# Patient Record
Sex: Female | Born: 1995 | Race: Black or African American | Hispanic: No | Marital: Single | Smoking: Never smoker
Health system: Southern US, Community
[De-identification: ages and names within clinical notes are randomized; demographics above are authoritative.]

---

## 2019-05-14 ENCOUNTER — Other Ambulatory Visit: Payer: Self-pay

## 2019-05-14 ENCOUNTER — Emergency Department (HOSPITAL_COMMUNITY): Payer: Self-pay

## 2019-05-14 ENCOUNTER — Emergency Department (HOSPITAL_COMMUNITY)
Admission: EM | Admit: 2019-05-14 | Discharge: 2019-05-14 | Disposition: A | Discharge status: Home / Self Care | Payer: Self-pay | Attending: Emergency Medicine | Admitting: Emergency Medicine

## 2019-05-14 ENCOUNTER — Encounter (HOSPITAL_COMMUNITY): Payer: Self-pay | Admitting: Emergency Medicine

## 2019-05-14 DIAGNOSIS — R1011 Right upper quadrant pain: Secondary | ICD-10-CM

## 2019-05-14 DIAGNOSIS — R1010 Upper abdominal pain, unspecified: Secondary | ICD-10-CM

## 2019-05-14 LAB — COMPREHENSIVE METABOLIC PANEL
ALT: 13 U/L (ref 0–44)
AST: 18 U/L (ref 15–41)
Albumin: 3.9 g/dL (ref 3.5–5.0)
Alkaline Phosphatase: 40 U/L (ref 38–126)
Anion gap: 6 (ref 5–15)
BUN: 5 mg/dL — ABNORMAL LOW (ref 6–20)
CO2: 27 mmol/L (ref 22–32)
Calcium: 9.2 mg/dL (ref 8.9–10.3)
Chloride: 106 mmol/L (ref 98–111)
Creatinine, Ser: 0.89 mg/dL (ref 0.44–1.00)
GFR calc Af Amer: >60 mL/min (ref >60)
GFR calc non Af Amer: >60 mL/min (ref >60)
Glucose, Bld: 88 mg/dL (ref 70–99)
Potassium: 4.2 mmol/L (ref 3.5–5.1)
Sodium: 139 mmol/L (ref 135–145)
Total Bilirubin: 0.4 mg/dL (ref 0.3–1.2)
Total Protein: 6.6 g/dL (ref 6.5–8.1)

## 2019-05-14 LAB — CBC
HCT: 37.3 % (ref 36.0–46.0)
Hemoglobin: 12.3 g/dL (ref 12.0–15.0)
MCH: 30.9 pg (ref 26.0–34.0)
MCHC: 33 g/dL (ref 30.0–36.0)
MCV: 93.7 fL (ref 80.0–100.0)
Platelets: 267 10*3/uL (ref 150–400)
RBC: 3.98 MIL/uL (ref 3.87–5.11)
RDW: 12.1 % (ref 11.5–15.5)
WBC: 4.6 10*3/uL (ref 4.0–10.5)
nRBC: 0 % (ref 0.0–0.2)

## 2019-05-14 LAB — URINALYSIS, ROUTINE W REFLEX MICROSCOPIC
Bilirubin Urine: NEGATIVE
Glucose, UA: NEGATIVE mg/dL
Hgb urine dipstick: NEGATIVE
Ketones, ur: NEGATIVE mg/dL
Nitrite: NEGATIVE
Protein, ur: NEGATIVE mg/dL
Specific Gravity, Urine: 1.038 — ABNORMAL HIGH (ref 1.005–1.030)
pH: 7 (ref 5.0–8.0)

## 2019-05-14 LAB — LIPASE, BLOOD: Lipase: 21 U/L (ref 11–51)

## 2019-05-14 LAB — I-STAT BETA HCG BLOOD, ED (MC, WL, AP ONLY): I-stat hCG, quantitative: <5 m[IU]/mL (ref <5)

## 2019-05-14 MED ORDER — FAMOTIDINE 20 MG PO TABS: 20.0000 mg | ORAL_TABLET | Freq: Two times a day (BID) | ORAL | 0 refills | Status: AC

## 2019-05-14 MED ORDER — HYDROMORPHONE HCL 1 MG/ML IJ SOLN
0.5000 mg | Freq: Once | INTRAMUSCULAR | Status: AC
  Administered 2019-05-14: 0.5 mg via INTRAVENOUS
  Filled 2019-05-14: qty 1

## 2019-05-14 MED ORDER — FAMOTIDINE 20 MG PO TABS
20.0000 mg | ORAL_TABLET | Freq: Once | ORAL | Status: AC
  Administered 2019-05-14: 20 mg via ORAL
  Filled 2019-05-14: qty 1

## 2019-05-14 MED ORDER — IOHEXOL 300 MG/ML  SOLN
100.0000 mL | Freq: Once | INTRAMUSCULAR | Status: AC | PRN
  Administered 2019-05-14: 100 mL via INTRAVENOUS

## 2019-05-14 MED ORDER — SUCRALFATE 1 G PO TABS: 1.0000 g | ORAL_TABLET | Freq: Three times a day (TID) | ORAL | 0 refills | Status: AC

## 2019-05-14 MED ORDER — SODIUM CHLORIDE 0.9% FLUSH: 3.0000 mL | Freq: Once | INTRAVENOUS | Status: DC

## 2019-05-14 MED ORDER — ALUM & MAG HYDROXIDE-SIMETH 200-200-20 MG/5ML PO SUSP
30.0000 mL | Freq: Once | ORAL | Status: AC
  Administered 2019-05-14: 30 mL via ORAL
  Filled 2019-05-14: qty 30

## 2019-05-14 MED ORDER — LIDOCAINE VISCOUS HCL 2 % MT SOLN
15.0000 mL | Freq: Once | OROMUCOSAL | Status: AC
  Administered 2019-05-14: 15 mL via ORAL
  Filled 2019-05-14: qty 15

## 2019-05-14 NOTE — ED Notes (Signed)
The pt reports that she just returned from ultra sound   She still has some abd pain but it is not as bad

## 2019-05-14 NOTE — Discharge Instructions (Signed)
1. Medications: Start taking Pepcid twice daily with meals for the next 2 weeks.  Take Carafate 3 times daily with meals and once at night for the next week. 2. Treatment: rest, drink plenty of fluids, advance diet slowly.  Avoid spicy foods, fried foods, fatty foods, alcohol, and acidic foods.  I have attached information for food choices you can make that can be more helpful.  Also try not to eat too close to bedtime; I usually recommend 2 hours between your last meal and bedtime. 3. Follow Up: Please followup with your primary doctor or gastroenterologist in 3 days for discussion of your diagnoses and further evaluation after today's visit; if you do not have a primary care doctor use the resource guide provided to find one; Please return to the ER for persistent vomiting, high fevers or worsening symptoms

## 2019-05-14 NOTE — ED Notes (Signed)
Patient transported to Ultrasound 

## 2019-05-14 NOTE — ED Triage Notes (Signed)
C/o intermittent sharp upper abd pain x 2 days.  Thought it was gas or constipation but has had normal BM.  Denies nausea, vomiting.  Mild pain with urination.

## 2019-05-14 NOTE — ED Provider Notes (Signed)
[facility] EMERGENCY DEPARTMENT Provider Note   CSN: [id number] Arrival date & time: 05/14/19  1150     History   Chief Complaint Chief Complaint  Patient presents with   Abdominal Pain    HPI Margaret Blanchard is a 23 y.o. female presents for evaluation of acute onset, progressively worsening upper abdominal pain for 3 days.  Reports constant sharp stabbing pain which radiates to the bilateral flanks, worsens after certain meals.  Initially the patient thought that she had gas or was constipated but has been having normal bowel movements.  Denies nausea, vomiting, chest pain, shortness of breath, or fevers.  She has noted that the pain in the upper abdomen worsens with urination but denies dysuria, hematuria, urgency, frequency, and vaginal itching bleeding or discharge.  She has not tried anything for her symptoms.     The history is provided by the patient.    History reviewed. No pertinent past medical history.  There are no active problems to display for this patient.   History reviewed. No pertinent surgical history.   OB History   No obstetric history on file.      Home Medications    Prior to Admission medications   Medication Sig Start Date End Date Taking? Authorizing Provider  famotidine (PEPCID) 20 MG tablet Take 1 tablet (20 mg total) by mouth 2 (two) times daily. 05/14/19   Karell Tukes A, PA-C  sucralfate (CARAFATE) 1 g tablet Take 1 tablet (1 g total) by mouth 4 (four) times daily -  with meals and at bedtime. 05/14/19   Jeanie Sewer, PA-C    Family History No family history on file.  Social History Social History   Tobacco Use   Smoking status: Never Smoker   Smokeless tobacco: Never Used  Substance Use Topics   Alcohol use: Not Currently   Drug use: Never     Allergies   Patient has no allergy information on record.   Review of Systems Review of Systems  Constitutional: Negative for chills and fever.    Respiratory: Negative for chest tightness and shortness of breath.   Cardiovascular: Negative for chest pain.  Gastrointestinal: Positive for abdominal pain. Negative for constipation, nausea and vomiting.  All other systems reviewed and are negative.    Physical Exam Updated Vital Signs BP 128/87 (BP Location: Right Arm)    Pulse (!) 57    Temp 98.5 F (36.9 C) (Oral)    Resp 14    LMP 05/03/2019    SpO2 100%   Physical Exam Vitals signs and nursing note reviewed.  Constitutional:      General: She is not in acute distress.    Appearance: She is well-developed.     Comments: Appears uncomfortable, resting in bed  HENT:     Head: Normocephalic and atraumatic.  Eyes:     General:        Right eye: No discharge.        Left eye: No discharge.     Conjunctiva/sclera: Conjunctivae normal.  Neck:     Vascular: No JVD.     Trachea: No tracheal deviation.  Cardiovascular:     Rate and Rhythm: Normal rate and regular rhythm.     Heart sounds: Normal heart sounds.  Pulmonary:     Effort: Pulmonary effort is normal.     Breath sounds: Normal breath sounds.  Abdominal:     General: Abdomen is flat. Bowel sounds are increased. There is no distension.  Palpations: Abdomen is soft.     Tenderness: There is abdominal tenderness in the right upper quadrant, epigastric area and left upper quadrant. There is right CVA tenderness. There is no left CVA tenderness, guarding or rebound. Negative signs include Murphy's sign.  Skin:    General: Skin is warm and dry.     Findings: No erythema.  Neurological:     Mental Status: She is alert.  Psychiatric:        Behavior: Behavior normal.      ED Treatments / Results  Labs (all labs ordered are listed, but only abnormal results are displayed) Labs Reviewed  COMPREHENSIVE METABOLIC PANEL - Abnormal; Notable for the following components:      Result Value   BUN 5 (*)    All other components within normal limits  URINALYSIS, ROUTINE  W REFLEX MICROSCOPIC - Abnormal; Notable for the following components:   APPearance HAZY (*)    Specific Gravity, Urine 1.038 (*)    Leukocytes,Ua TRACE (*)    Bacteria, UA RARE (*)    All other components within normal limits  LIPASE, BLOOD  CBC  I-STAT BETA HCG BLOOD, ED ([facility], [facility], [facility] ONLY)    EKG None  Radiology Ct Abdomen Pelvis W Contrast  Result Date: 05/14/2019 CLINICAL DATA:  Right upper quadrant abdominal pain for 2 days EXAM: CT ABDOMEN AND PELVIS WITH CONTRAST TECHNIQUE: Multidetector CT imaging of the abdomen and pelvis was performed using the standard protocol following bolus administration of intravenous contrast. CONTRAST:  126mL OMNIPAQUE IOHEXOL 300 MG/ML  SOLN COMPARISON:  Same-day right upper quadrant ultrasound FINDINGS: Lower chest: No acute abnormality. Hepatobiliary: No focal liver abnormality is seen. No gallstones, gallbladder wall thickening, or biliary dilatation. Pancreas: Unremarkable. No pancreatic ductal dilatation or surrounding inflammatory changes. Spleen: Normal in size without focal abnormality. Adrenals/Urinary Tract: Adrenal glands are unremarkable. Kidneys are normal, without renal calculi, focal lesion, or hydronephrosis. Bladder is unremarkable. Stomach/Bowel: Stomach is within normal limits. Appendix is not definitively identified given the lack of intra-abdominal fat. No pericecal inflammatory changes are evident to suggest acute appendicitis. No evidence of bowel wall thickening, distention, or inflammatory changes. Vascular/Lymphatic: No significant vascular findings are present. No enlarged abdominal or pelvic lymph nodes. Reproductive: Unremarkable uterus. Small bilateral ovarian cysts, which do not meet size criteria for dedicated or follow-up imaging. Other: Trace free fluid within the posterior cul-de-sac, which may be physiologic. No abdominal wall hernia. Musculoskeletal: No acute or significant osseous findings. IMPRESSION: 1. No CT findings to  explain the patient's right upper quadrant pain. 2. Trace free fluid in the posterior cul-de-sac, which may be physiologic. Electronically Signed   By: Davina Poke M.D.   On: 05/14/2019 13:31   US Abdomen Limited Ruq  Result Date: 05/14/2019 CLINICAL DATA:  3 day history of worsening abdominal pain. EXAM: ULTRASOUND ABDOMEN LIMITED RIGHT UPPER QUADRANT COMPARISON:  None. FINDINGS: Gallbladder: No gallstones or gallbladder wall thickening. No pericholecystic fluid. The sonographer reports no sonographic Murphy's sign. Common bile duct: Diameter: 2 mm Liver: No focal lesion identified. Within normal limits in parenchymal echogenicity. Portal vein is patent on color Doppler imaging with normal direction of blood flow towards the liver. Other: None. IMPRESSION: Unremarkable study. Electronically Signed   By: Misty Stanley M.D.   On: 05/14/2019 13:15    Procedures Procedures (including critical care time)  Medications Ordered in ED Medications  HYDROmorphone (DILAUDID) injection 0.5 mg (0.5 mg Intravenous Given 05/14/19 1252)  iohexol (OMNIPAQUE) 300 MG/ML solution 100 mL (  100 mLs Intravenous Contrast Given 05/14/19 1311)  famotidine (PEPCID) tablet 20 mg (20 mg Oral Given 05/14/19 1352)  alum & mag hydroxide-simeth (MAALOX/MYLANTA) 200-200-20 MG/5ML suspension 30 mL (30 mLs Oral Given 05/14/19 1352)    And  lidocaine (XYLOCAINE) 2 % viscous mouth solution 15 mL (15 mLs Oral Given 05/14/19 1352)     Initial Impression / Assessment and Plan / ED Course  I have reviewed the triage vital signs and the nursing notes.  Pertinent labs & imaging results that were available during my care of the patient were reviewed by me and considered in my medical decision making (see chart for details).        Patient presenting for evaluation of upper abdominal pain for 3 days.  She is afebrile, vital signs are stable.  She is nontoxic in appearance.  On examination has upper abdominal pain, maximally  tender in the right upper quadrant but Murphy sign absent.  No peritoneal signs. No lower abdominal pain on examination. Labwork reviewed by me shows no leukocytosis, no anemia, no metabolic derangements, no renal insufficiency.  UA does not suggest UTI or nephrolithiasis.  Given she is maximally tender to palpation in the right upper quadrant or right upper quadrant ultrasound was obtained which shows no evidence of acute hepatobiliary pathology.  CT shows no evidence of acute surgical abdominal pathology including obstruction, perforation, appendicitis, cholecystitis, or perfed diverticulum.  Creatinine, lipase, LFTs are within normal limits.  Doubt PID, TOA, ovarian torsion, or ectopic pregnancy in the absence of GU complaints or lower abdominal pain on examination.  On reevaluation patient is resting comfortably no apparent distress.  Serial abdominal examinations are benign and she is tolerating p.o. food and fluids without difficulty.  Reports improvement after Pepcid and GI cocktail.  Suspect possible GERD/PUD.  Will discharge with course of Pepcid and Carafate, discussed dietary modifications.  Recommend follow with PCP or gastroenterology for reevaluation of symptoms.  Discussed strict ED return precautions. Pt verbalized understanding of and agreement with plan and is safe for discharge home at this time.   Final Clinical Impressions(s) / ED Diagnoses   Final diagnoses:  RUQ pain  Pain of upper abdomen    ED Discharge Orders         Ordered    famotidine (PEPCID) 20 MG tablet  2 times daily     05/14/19 1548    sucralfate (CARAFATE) 1 g tablet  3 times daily with meals & bedtime     05/14/19 1548           Jeanie SewerFawze, Uchenna Seufert A, PA-C 05/15/19 [id number]    Alvira MondaySchlossman, Erin, MD 05/17/19 1304

## 2021-07-02 IMAGING — CT CT ABD-PELV W/ CM
2 of 4 series · 16 of 46 positions shown, 18 images · IV contrast (Omni 300)
Comparison: Same-day right upper quadrant ultrasound

CLINICAL DATA: Right upper quadrant abdominal pain for 2 days

EXAM:
CT ABDOMEN AND PELVIS WITH CONTRAST
TECHNIQUE: Multidetector CT imaging of the abdomen and pelvis was performed
using the standard protocol following bolus administration of
intravenous contrast.
CONTRAST:  100mL OMNIPAQUE IOHEXOL 300 MG/ML  SOLN

[Series 3: a/p w/ 5mm · axial · 0.74mm/px · z∈[+782,+1177]mm · 13 of 87 slices shown, 15 images]
[im 4/87  soft-tissue]
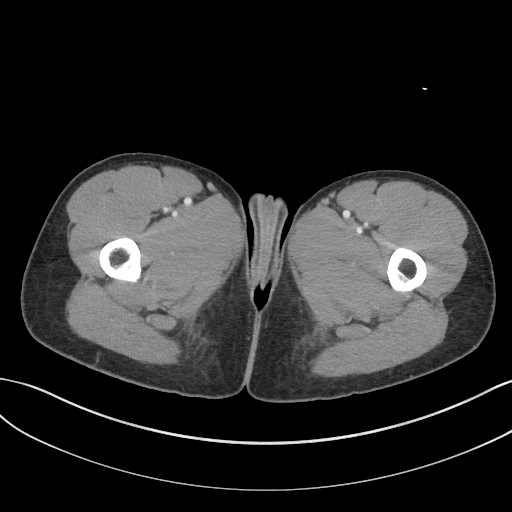
[im 4/87  bone]
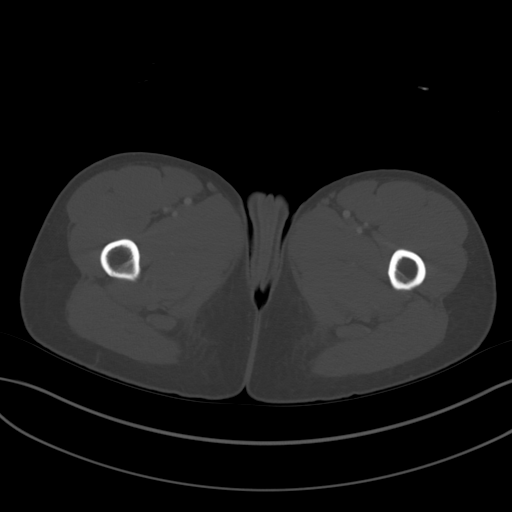
[im 10/87  soft-tissue]
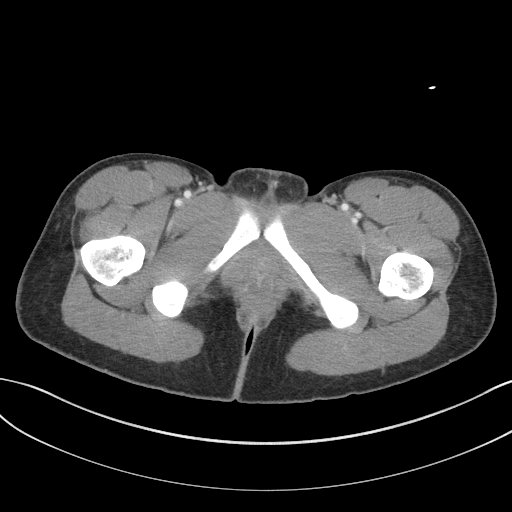
[im 17/87  soft-tissue]
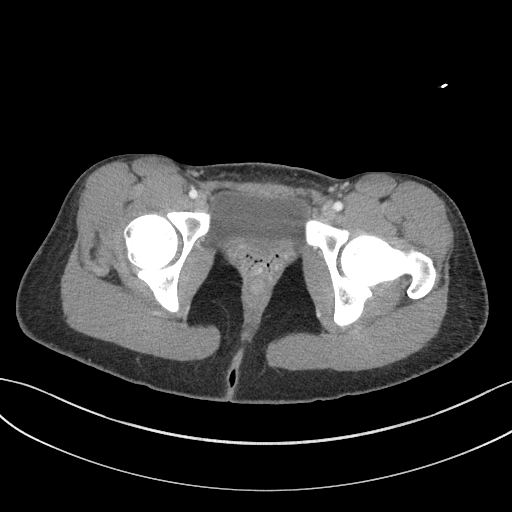
[im 24/87  soft-tissue]
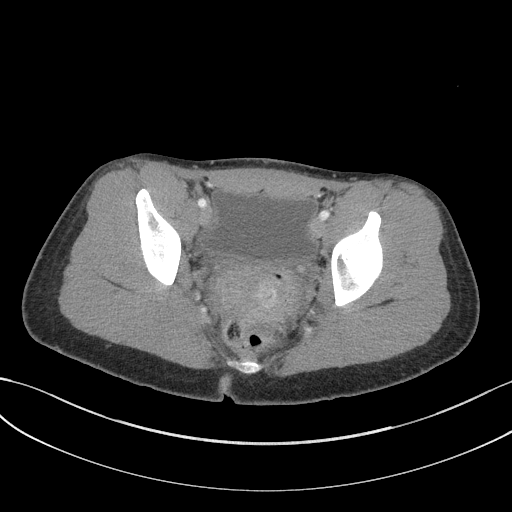
[im 30/87  soft-tissue]
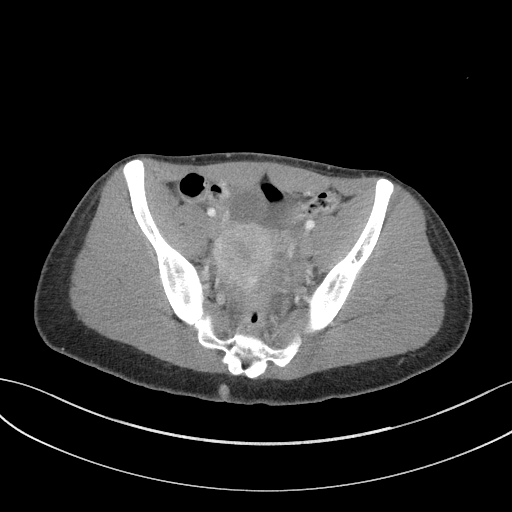
[im 37/87  soft-tissue]
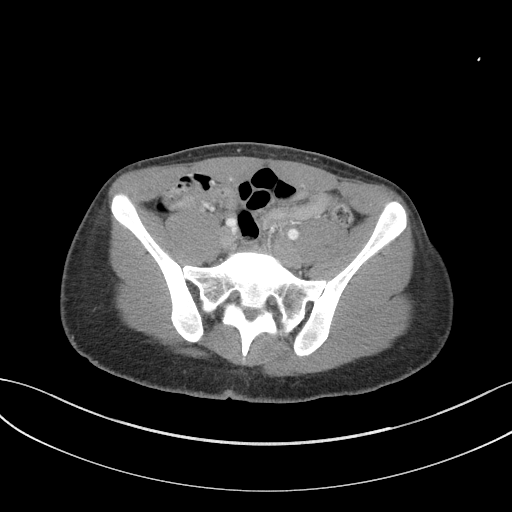
[im 44/87  soft-tissue]
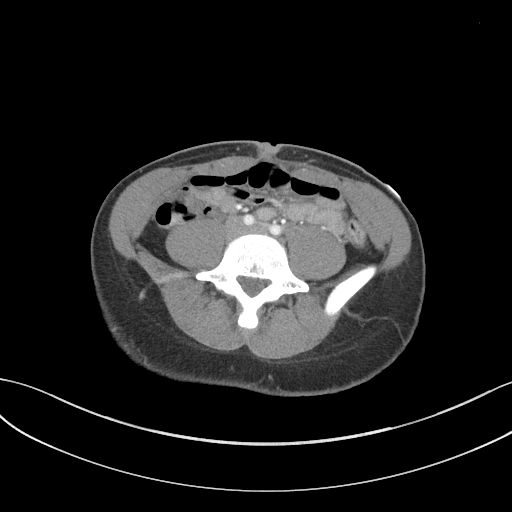
[im 50/87  soft-tissue]
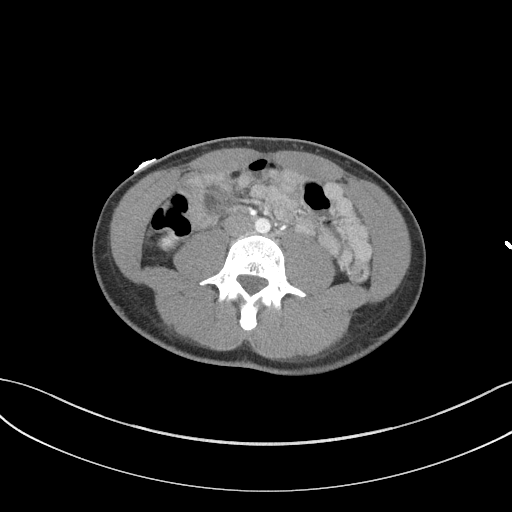
[im 57/87  soft-tissue]
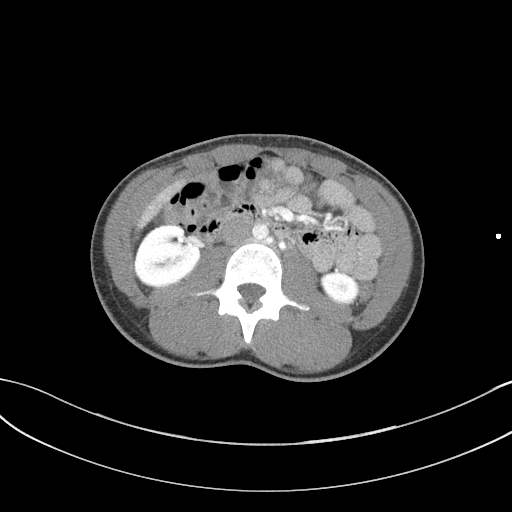
[im 57/87  bone]
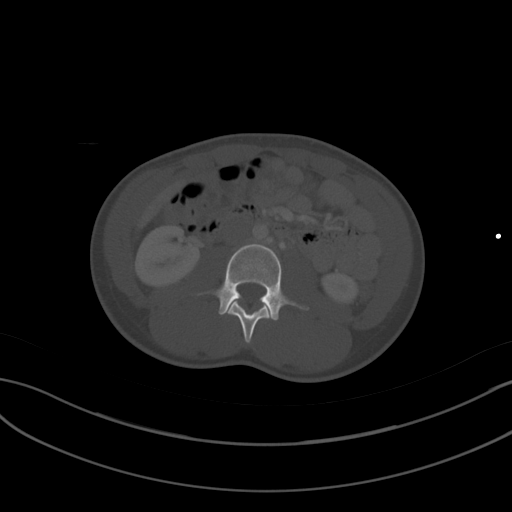
[im 63/87  soft-tissue]
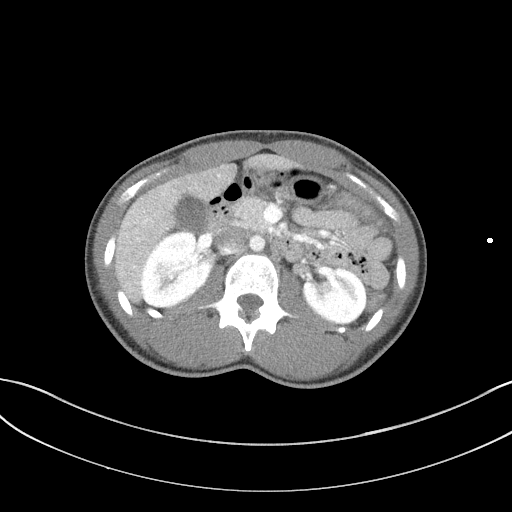
[im 70/87  soft-tissue]
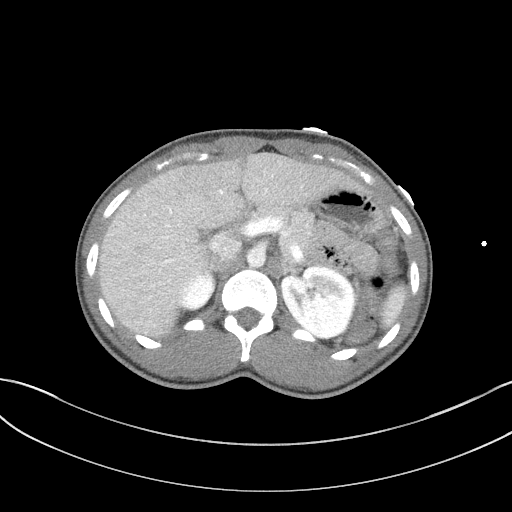
[im 77/87  soft-tissue]
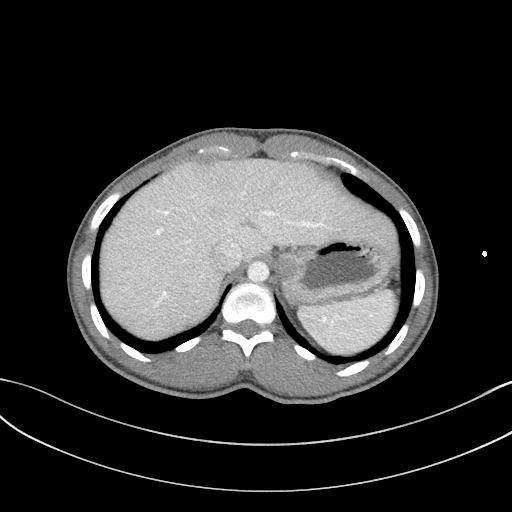
[im 83/87  soft-tissue]
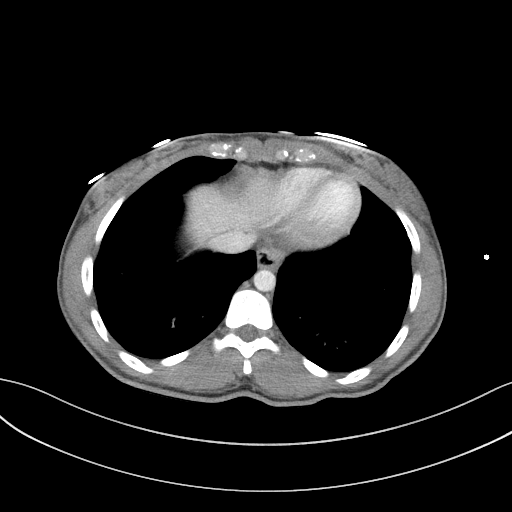

[Series 6: a/p w/ cor · coronal · 0.77mm/px · 3 of 127 slices shown]
[im 43/127  soft-tissue]
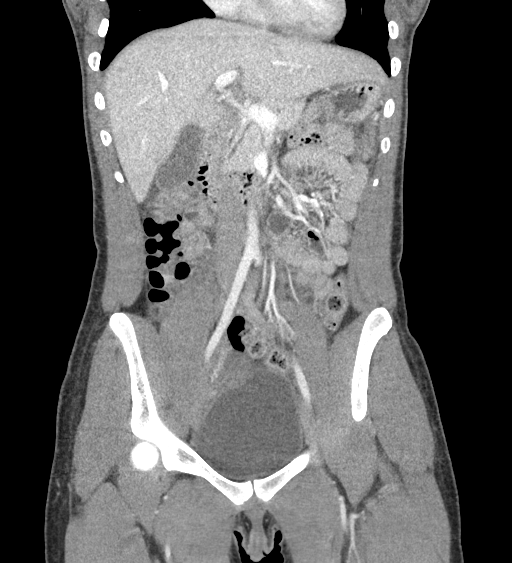
[im 57/127  soft-tissue]
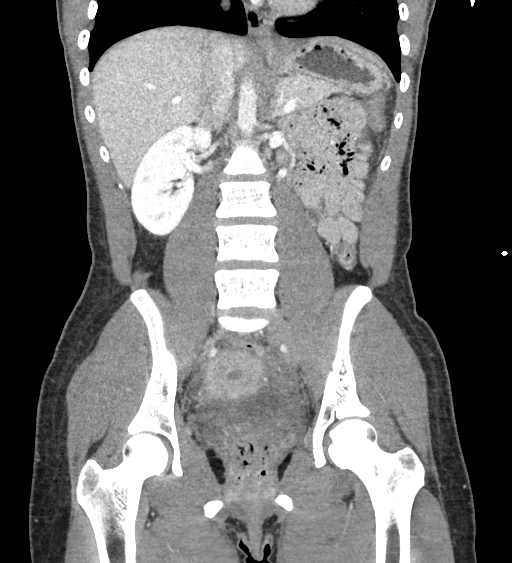
[im 71/127  soft-tissue]
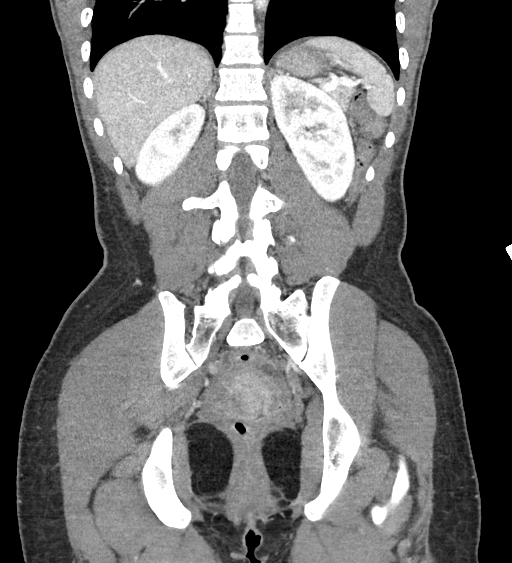

[16 of 46 positions shown; findings below may reference images not displayed]

FINDINGS: Lower chest: No acute abnormality.

Hepatobiliary: No focal liver abnormality is seen. No gallstones,
gallbladder wall thickening, or biliary dilatation.

Pancreas: Unremarkable. No pancreatic ductal dilatation or
surrounding inflammatory changes.

Spleen: Normal in size without focal abnormality.

Adrenals/Urinary Tract: Adrenal glands are unremarkable. Kidneys are
normal, without renal calculi, focal lesion, or hydronephrosis.
Bladder is unremarkable.

Stomach/Bowel: Stomach is within normal limits. Appendix is not
definitively identified given the lack of intra-abdominal fat. No
pericecal inflammatory changes are evident to suggest acute
appendicitis. No evidence of bowel wall thickening, distention, or
inflammatory changes.

Vascular/Lymphatic: No significant vascular findings are present. No
enlarged abdominal or pelvic lymph nodes.

Reproductive: Unremarkable uterus. Small bilateral ovarian cysts,
which do not meet size criteria for dedicated or follow-up imaging.

Other: Trace free fluid within the posterior cul-de-sac, which may
be physiologic. No abdominal wall hernia.

Musculoskeletal: No acute or significant osseous findings.
IMPRESSION: 1. No CT findings to explain the patient's right upper quadrant
pain.
2. Trace free fluid in the posterior cul-de-sac, which may be
physiologic.

## 2021-07-02 IMAGING — US US ABDOMEN LIMITED
1 series · 14 of 25 positions shown · non-contrast
Comparison: None.

CLINICAL DATA: 3 day history of worsening abdominal pain.

EXAM:
ULTRASOUND ABDOMEN LIMITED RIGHT UPPER QUADRANT

[Series 1: us abdomen limited · 14 of 52 slices shown]
[im 1/52]
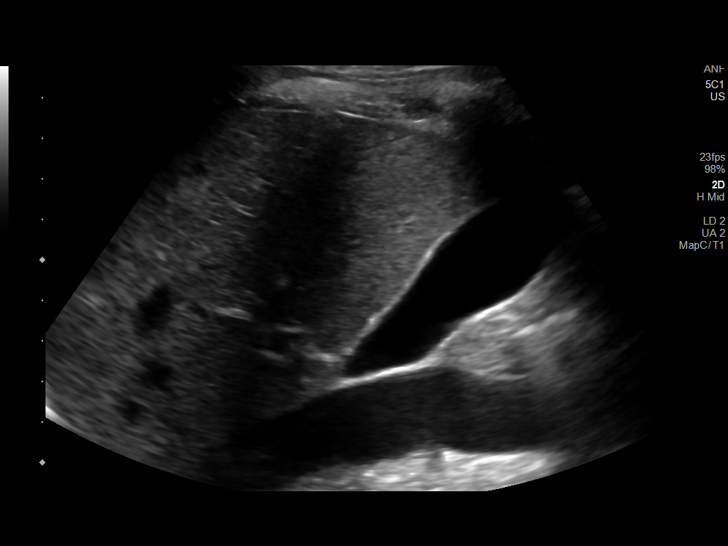
[im 5/52]
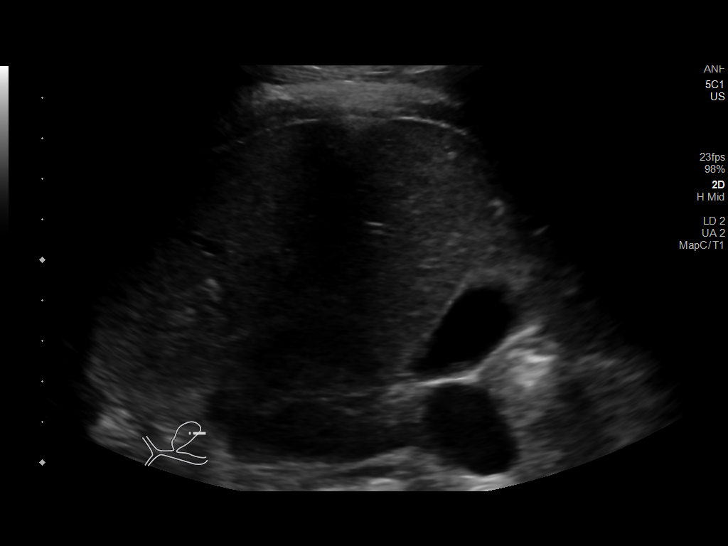
[im 9/52]
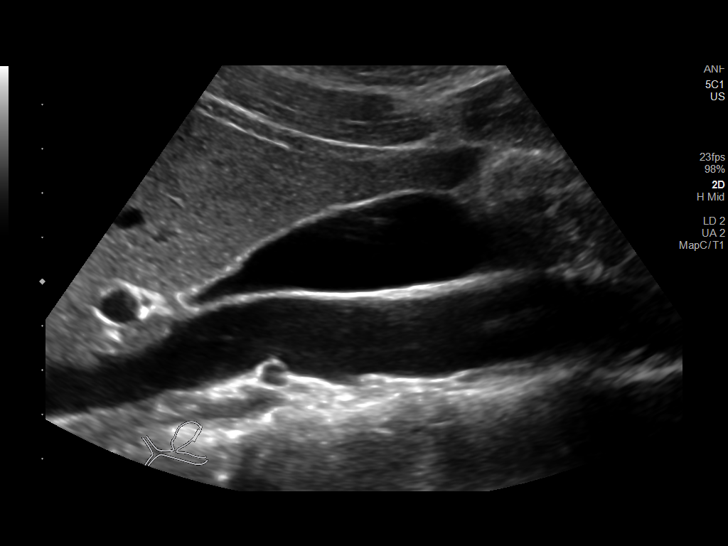
[im 13/52]
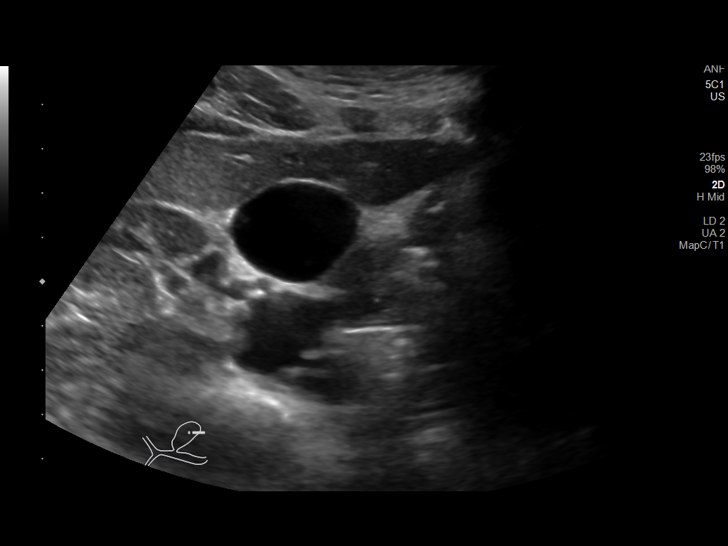
[im 18/52]
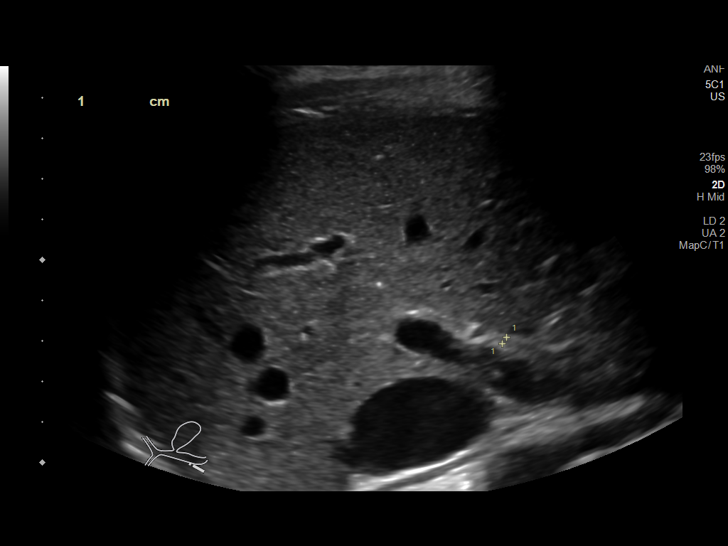
[im 20/52]
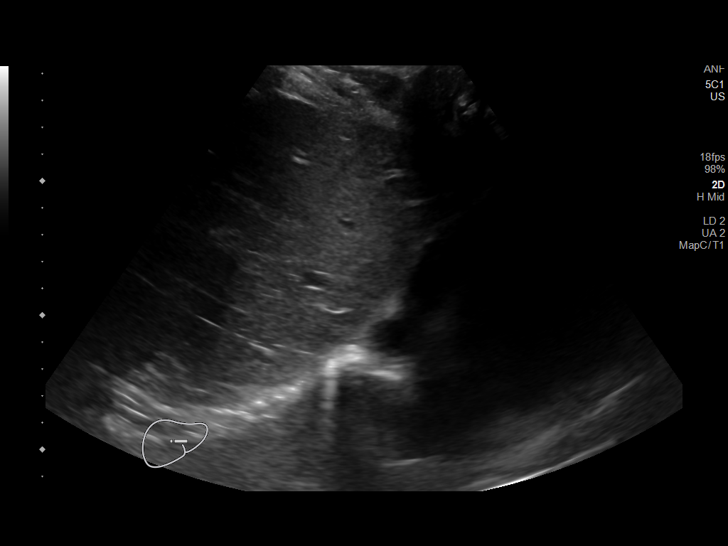
[im 24/52]
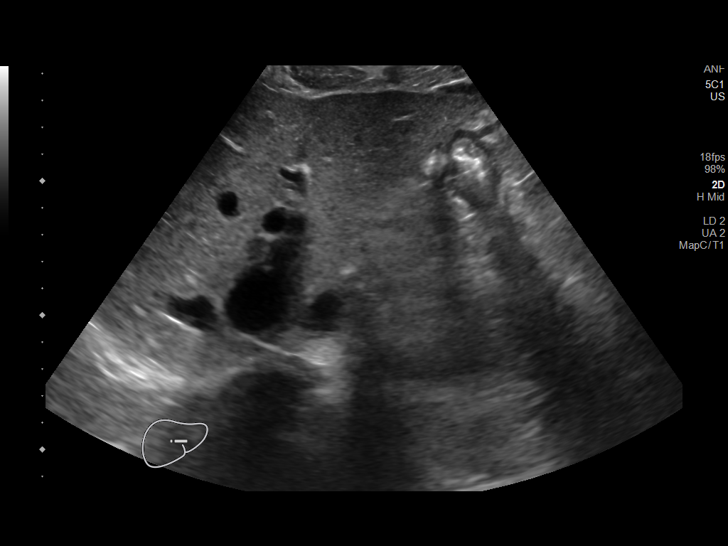
[im 28/52]
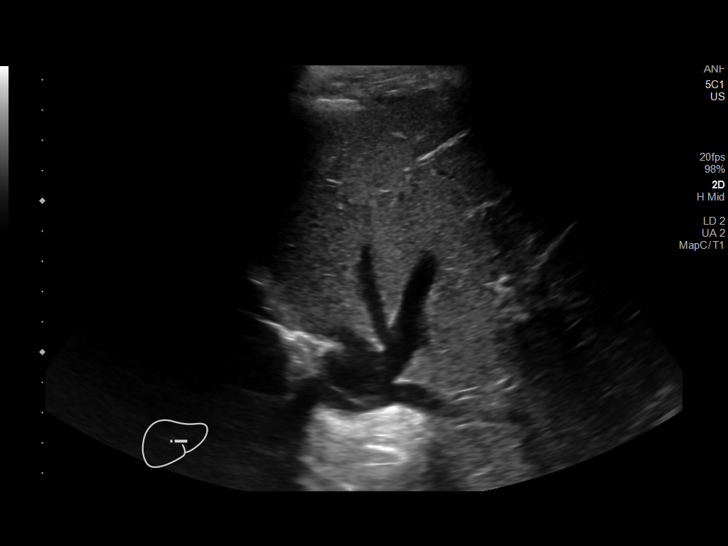
[im 32/52]
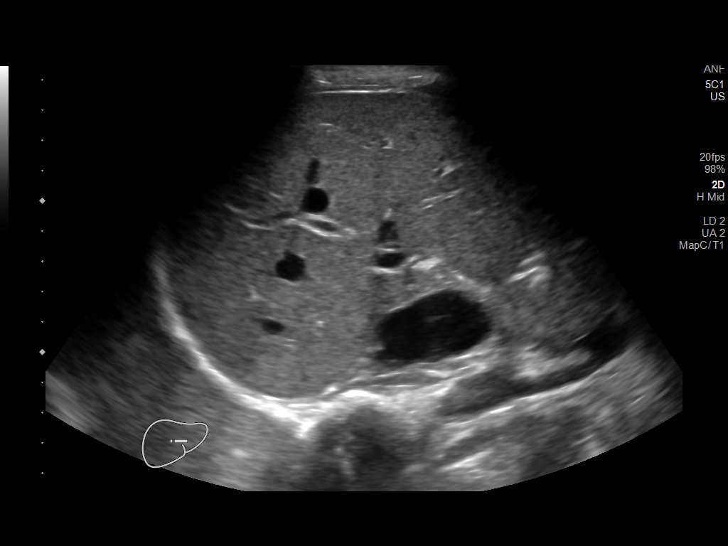
[im 35/52]
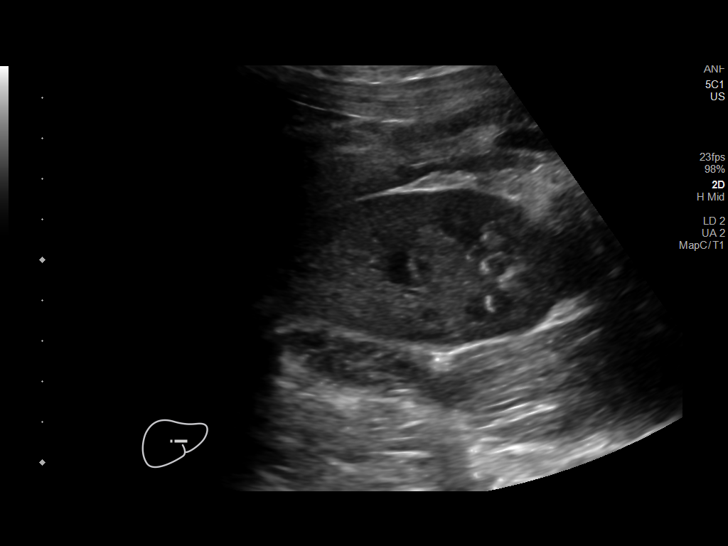
[im 39/52]
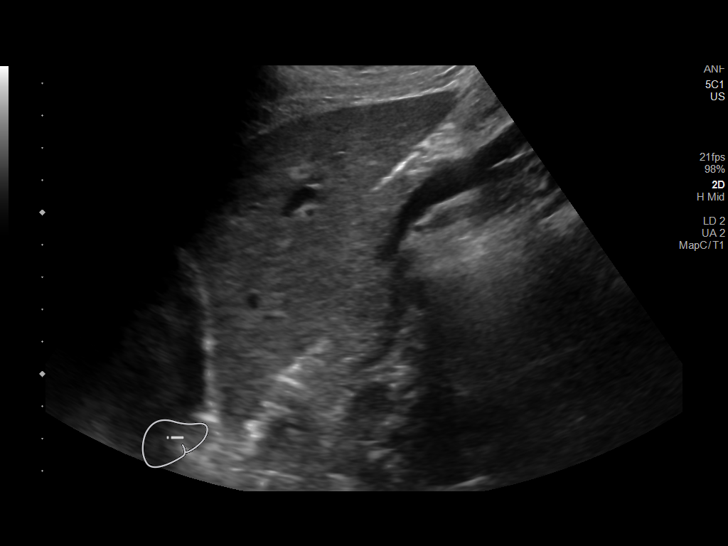
[im 43/52]
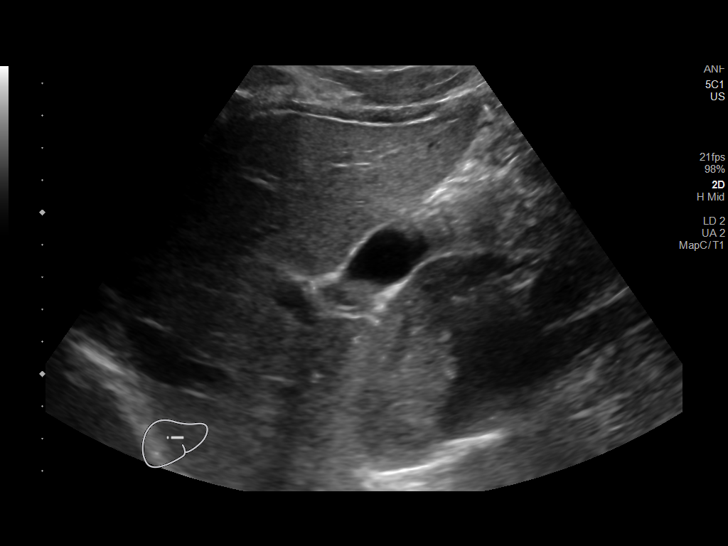
[im 47/52]
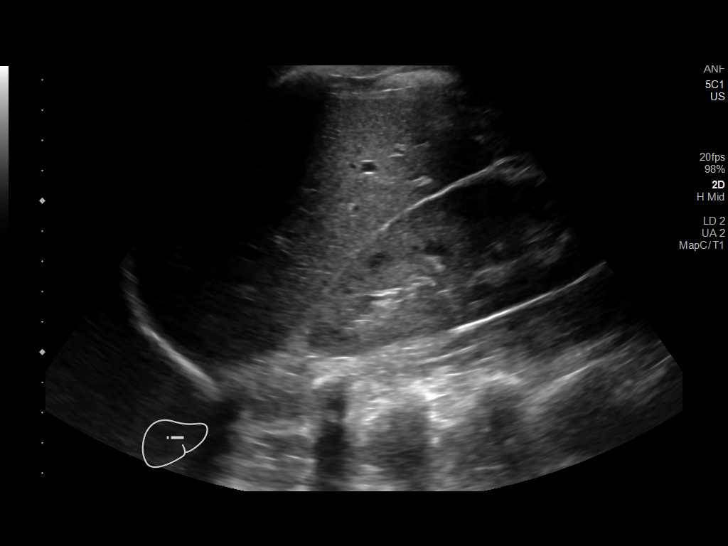
[im 52/52]
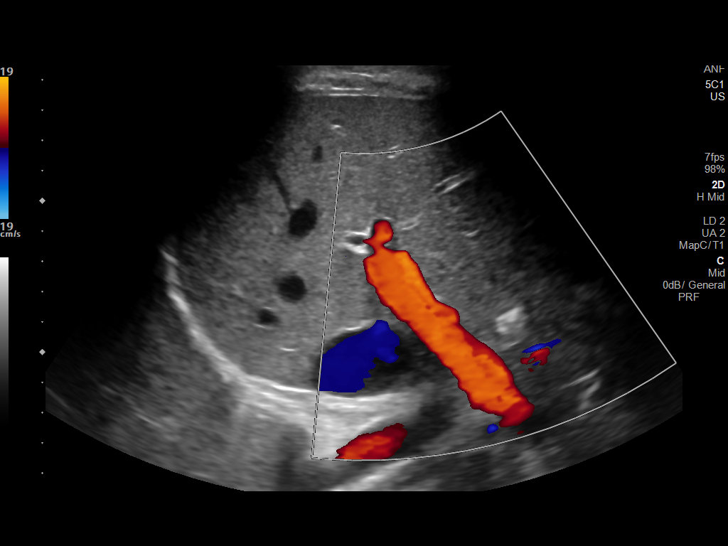

[14 of 25 positions shown; findings below may reference images not displayed]

FINDINGS: Gallbladder:

No gallstones or gallbladder wall thickening. No pericholecystic
fluid. The sonographer reports no sonographic Murphy's sign.

Common bile duct:

Diameter: 2 mm

Liver:

No focal lesion identified. Within normal limits in parenchymal
echogenicity. Portal vein is patent on color Doppler imaging with
normal direction of blood flow towards the liver.

Other: None.
IMPRESSION: Unremarkable study.
